# Patient Record
Sex: Female | Born: 1935 | Hispanic: No | Marital: Single
Health system: Midwestern US, Academic
[De-identification: ages and names within clinical notes are randomized; demographics above are authoritative.]

---

## 2016-09-09 ENCOUNTER — Encounter: Admit: 2016-09-09 | Discharge: 2016-09-09 | Payer: MEDICARE

## 2016-09-15 ENCOUNTER — Encounter: Admit: 2016-09-15 | Discharge: 2016-09-15 | Payer: MEDICARE

## 2016-09-23 ENCOUNTER — Encounter: Admit: 2016-09-23 | Discharge: 2016-09-23 | Payer: MEDICARE

## 2016-09-29 ENCOUNTER — Encounter: Admit: 2016-09-29 | Discharge: 2016-09-29 | Payer: MEDICARE

## 2016-09-29 ENCOUNTER — Ambulatory Visit: Admit: 2016-09-29 | Discharge: 2016-09-30 | Payer: MEDICARE

## 2016-09-29 DIAGNOSIS — R609 Edema, unspecified: Principal | ICD-10-CM

## 2016-09-29 DIAGNOSIS — R030 Elevated blood-pressure reading, without diagnosis of hypertension: ICD-10-CM

## 2016-09-29 DIAGNOSIS — Z950 Presence of cardiac pacemaker: ICD-10-CM

## 2016-09-29 DIAGNOSIS — I2510 Atherosclerotic heart disease of native coronary artery without angina pectoris: ICD-10-CM

## 2016-09-29 DIAGNOSIS — D499 Neoplasm of unspecified behavior of unspecified site: Principal | ICD-10-CM

## 2016-09-29 DIAGNOSIS — I4891 Unspecified atrial fibrillation: ICD-10-CM

## 2016-09-29 DIAGNOSIS — R0602 Shortness of breath: ICD-10-CM

## 2016-09-29 DIAGNOSIS — Z45010 Encounter for checking and testing of cardiac pacemaker pulse generator [battery]: ICD-10-CM

## 2016-09-29 DIAGNOSIS — E785 Hyperlipidemia, unspecified: ICD-10-CM

## 2016-09-29 NOTE — Progress Notes
.  As he could take several days for the peak effect of antihypertensive therapy to fully reveal itselfDate of Service: 09/29/2016    Stephanie Meadows is a 83 y.o. female.       HPI     Stephanie Meadows presents today for a follow-up of her hypertension with history of bradycardia and pacemaker implant.  She has had paroxysmal atrial fibrillation but none recently while on sotalol.  She is getting care at the [facility] after spending her life as a none.  She has records from there that show blood pressure readings in the past month between 120 and 190 systolic with diastolics generally around 80.  She is not symptomatic from these but they are well documented with multiple readings over 180.  Handwritten note on the [facility] computerized blood pressure report indicates that she is started on losartan HCTZ 100/25 on June 19 at the direction of her new primary care physician Dr. Herschell Dimes.  Her blood pressure readings since then in the [facility] records are trending lower than the readings that were showing 180 before adding the losartan HCTZ although she still has readings in the 160 range.    She has records from April [facility] ED visit with hyponatremia at 126 leading to discontinuation of hydrochlorothiazide. Her last sodium (in May) being 133.  She is comfortable with her environment at [employer] and night times in the doing center.  She is very cautious about her poor balance and uses a walker or wheelchair almost exclusively as she is aware of the risks of falling leading to durable complications.  She has not fallen she has not lost consciousness.  She has not felt that she has had atrial fibrillation.  She told me today that she hoped that she would be doing as well in my opinion as she is in her opinion and that she would get off easy.           Vitals:    09/29/16 1114   BP: 128/62   Pulse: 74   Weight: 64.4 kg (142 lb)   Height: 1.549 m (5' 1) Body mass index is 26.83 kg/m???.     Past Medical History  Patient Active Problem List    Diagnosis Date Noted   ??? On continuous oral anticoagulation 11/27/2015   ??? Syncope, near 01/22/2015   ??? Cardiac pacemaker in situ 04/16/2014   ??? Edema 12/13/2012     2012 associated with norvasc  2014 associated with cellulitis ntpBNP normal at 426       ??? Essential hypertension 12/26/2009     BP 180+ in  7/11 when in ER with dizziness. Dx hypertension not established.   Jan 2012, further hypertension with dysequilibrium. 1/2 CT head: Age appropriate changes with no mass or bleed, carotid US: Minor early vascular disease  05/30/10 BP 120/80 Dr D. Seibert office, 2-3+ Pitting edema on Norvasc 10, drug DC'd as likely cause, edema improves significantly.   06/17/10 BP 144/68,  Trace edema MAC office CBP. Pt's home BP logs show readign to 159/86 off Norvasc 10 on no Rx. CMP 2/23: BUN 11 Creat 0.8, K 4.1       ??? Shortness of breath on exertion 11/06/2008     a. 12/00-Sub max 81% HR Ex Echo EF 60%, mild AoSclerosis, pericardial effusion, non-ischemic  b. 2002 Ex echo 85% HR EF 60% non ischemic.   c. 2004 91% HR Ex echo. EF 60% no ischemia  D.2005 Mod  Bruce Stress Echo 13 minute duration, Ef 55%, No ischemia     ??? DDD pacer implant 2004 due to sick sinus syndrome 11/19/2006     a. 4/04 rapid atrial fibrillation with marked slowing with diltiazem  B. 07/18/02 DDD-R pacer, Dr. Naoma Diener. Sotalol 120 BID added as [place] inpt. Coumadin chronic  C  11/19/06 lead replaced, Dr. Kathleene Hazel.  D. 07/04/09 Generator replaced Dr. Bradly Bienenstock after conversion to VVI mode from DDD produced dyspnea ajnd congestion requiring lasix  E. 09/20/12 New RA lead and generator change - Dr. Betti Cruz     ??? AF (paroxysmal atrial fibrillation) (HCC) 11/19/2006     Paroxysmal atrial fibrillation from 8/02 w/ sinus node dysfx and DDDR pacing  a. 4/04 rapid atrial fibrillation with marked slowing with diltiazem  b. Intermittent atrial fibrillation noted on pacer surveillance c 09/2013: Pacer interrogation reveals < 1 episode of AT/AF with atrial rates to 400 up of to 10 hours duration. No symptoms. Sotalol and warfarin contined.      ??? Dyslipidemia 11/19/2006     a. 04/19/03 total 253 Trig 159 HDL 79 LDL 156 on Lipitor 10 w/ possible myalgias, try Vytorin 10/40 left arm swelling with Vytorin, DC'd.  b. 12/25/03 total 276 trig 276 HDL 63 LDL 196 No meds, 9/28 Add Vytorin 10/203/5/10 total 189, trig 152, HDL 71, LDL 97  1/11 total 198 trig 152 HDL 66, LDL 109  06/25/11 total 182 trig 156 HDL 70 LDL 95  09/2013 LDL to 197 off Vytorin 10/20. 05/2014, start simvastatin 20         ??? Osteoporosis 11/19/2006   ??? Personal history of breast cancer 11/19/2006     Bilateral mastectomies           Review of Systems   Constitution: Negative.   HENT: Negative.    Eyes: Negative.    Cardiovascular: Positive for dyspnea on exertion.   Respiratory: Positive for shortness of breath.    Endocrine: Negative.    Hematologic/Lymphatic: Negative.    Skin: Negative.    Musculoskeletal: Negative.    Gastrointestinal: Positive for bowel incontinence.   Genitourinary: Positive for bladder incontinence, hesitancy and urgency.   Neurological: Positive for loss of balance.   Psychiatric/Behavioral: Negative.    Allergic/Immunologic: Negative.        Physical Exam  Wheelchair Exam at her request.   Skin: Non-icteric, skin warm and dry, Left delto-pectoral electronic pulse generator. 3 cm nearly healed slightly pink incision line right forehead  Eyes: Wearing glasses, pupils equal and round   Neck Veins: CVP <6-8, No hepato-jugular reflux  Thyroid: not enlarged   Lungs: Breathing comfortably. Lungs clear to percussion and auscultation. No rales, rhonchi or wheezing   Cardiac: Regular rhythm.. No rub or third sound. No murmur   Abdomen: soft, non-tender,  Pedal Pulses: 1-2+ PT pulses   Lower Extremity Edema: 2-3 mm ankle edema.  Motor: normal strength   Cognitive: Good insight, clear historian, no depression Upper Extremity Exam: Bilateral compression sleeves    Cardiovascular Studies  EKG today shows atrial pacing rate 74 diffuse T-wave changes without deep T inversion.    Problems Addressed Today  No diagnosis found.    Assessment and Plan     Today a week after the addition of losartan 100/HCTZ 25 under Dr. Renold Don direction her blood pressure in my office looks quite good at 128/62.   I believe that today's reading in the office should be an indication that boosting her antihypertensive therapy today because of  prior readings earlier this week in the elevated range is imprudent.  Basically I am going to accept the reading today in clinic as an indication that it would be better to leave her on current medications and see how her blood pressure trend develops that would be to add another medication I could put her at risk for falls.    Dr. Herschell Dimes is seein your blood pressure program looks complex enough with excellent result today in the office with me.  In this setting I think we should leave your medicines as they are.  Dr. Herschell Dimes started you on a powerful medication just a week ago losartan 100/HCTZ 25.  It would be a mistake me to add anything else to that program now.  I would like you want you to stay on that program and see how your blood pressure readings develop over the next few weeks and let him decide about adding another drug when he sees you again.  I was thinking about adding amlodipine to your program gSister Symons monthly.  I think it would be permanently find to leave her on her current medical program until he sees her again in about 3 weeks.  Her atrial fibrillation seems suppressed on current therapy.  She is tolerating the sotalol well.  The pacer which was implanted for sick sinus syndrome is ensuring that she is not going to develop significant bradycardia on the sotalol.  Records indicate she is on warfarin for stroke prevention.  With her extreme attention to fall avoidance and her improvement in sodium I am quite comfortable leaving her on her current medical program.    I am not sure how often I need to see her perhaps again in 6 months.  With close follow-up by Dr. Herschell Dimes at the [facility] I think she could discuss with him whether she could extend the interval between visits with me to a bit longer.      NB: This document was prepared within the Epic(TM) EMR using templates developed by the [facility] of [facility]. It can be accessed online through [facility] feature by clinicians at other Epic institutions.  The free text in this document, was generated through Dragon(TM) software.  Editing and proofreading were done by the author of this document Dr. Mable Paris MD, [facility] principally at the point of care.  In spite of the author's best effort to identify every air introduced by voice to text dictation, some errors that may represent misspelling or misstatements of what was dictated may persist.  If there are questions about content in this document please contact Dr. Hale Bogus.    The written information I provided Ms. Natt at the conclusion of today's encounter is as  follows:    Patient Instructions   I want to leave you on your current blood pressure program.  Dr. Herschell Dimes just increased your medication with the losartan/HCTZ last week.  I think he should get a blood test today to make sure your blood sodium and potassium look good.  I think you should continue to take blood pressure readings at the doing center and let him decide in a month about adding more medication.  I was    I was thinking about adding amlodipine today until I realize you had just started on a powerful new medicine last week.  Amlodipine might be a good choice if Dr. Herschell Dimes is trying to decide what to do next when he sees you.    Not  sure how often I need to see you in follow-up.  You do have a pacer in which we need to keep track of but everything else seems to be doing fine.  If you go back into atrial fibrillation you need to see me or 1 of my colleagues but if that does not happen while on sotalol I think from a heart standpoint you are doing well and we do not need to do anything different.    Call in if you have any questions.  Keep tracking the pacemaker remotely.  Marissa Nestle, MD so I will see you probably every 6 months and he may want to extend at 1 he is months with       ]    Current Medications (including today's revisions)  ??? acetaminophen (TYLENOL) 500 mg tablet Take 500 mg by mouth every 6 hours as needed for Pain. Max of 4,000 mg of acetaminophen in 24 hours.   ??? artificial tears(hypromellose) 0.4 % ophthalmic solution Place 1 drop into or around eye(s) every 4 hours as needed.   ??? C,E,zinc,copper 11/omega3s/lut (OCUVITE ADULT 50 PLUS PO) Take 1 tablet by mouth daily.   ??? ergocalciferol (VITAMIN D-2) 50,000 unit capsule Take 1 capsule by mouth every 7 days.   ??? labetalol (NORMODYNE) 300 mg tablet Take 2 Tabs by mouth twice daily.   ??? loperamide (IMODIUM A-D) 2 mg capsule Take 2 mg by mouth as Needed for Diarrhea. Take 2 capsules by mouth initially, followed by 1 capsule by mouth after each loose stool up to a maximum of 8 tablets in 24 hours.   ??? losartan-hydrochlorothiazide (HYZAAR) 100-25 mg tablet Take 1 tablet by mouth every morning.   ??? melatonin 5 mg tab Take 5 mg by mouth at bedtime daily.   ??? omeprazole DR(+) (PRILOSEC) 20 mg capsule Take 20 mg by mouth daily before breakfast.   ??? polycarbophil (FIBERCON) 625 mg tablet Take 1,250 mg by mouth three times daily.   ??? simvastatin (ZOCOR) 20 mg tablet Take 1 Tab by mouth at bedtime daily.   ??? sotalol (BETAPACE) 120 mg Tab Take 1 Tab by mouth twice daily.   ??? warfarin (COUMADIN) 5 mg tablet Take 1 tablet by mouth daily.

## 2016-10-02 ENCOUNTER — Encounter: Admit: 2016-10-02 | Discharge: 2016-10-02 | Payer: MEDICARE

## 2016-10-02 DIAGNOSIS — R0602 Shortness of breath: ICD-10-CM

## 2016-10-02 DIAGNOSIS — R609 Edema, unspecified: Principal | ICD-10-CM

## 2016-10-02 LAB — COMPREHENSIVE METABOLIC PANEL
Lab: 11
Lab: 131 — ABNORMAL LOW (ref 136–145)
Lab: 134 — ABNORMAL HIGH (ref 83–110)
Lab: 4.2
Lab: 76
Lab: 87
Lab: 91 — ABNORMAL LOW (ref 98–107)
Lab: 19 — ABNORMAL HIGH (ref 0–14)
Lab: 21
Lab: 25
Lab: 25
Lab: 4
Lab: 6.4
Lab: 71

## 2016-10-02 NOTE — Telephone Encounter
Spoke to [place] who called requesting help w sending remote carelink. Discussed tips and tricks of sending, reviewed carelink later in the day. Transmission received. LM on pts VM taht we got the transmission.

## 2016-10-03 ENCOUNTER — Ambulatory Visit: Admit: 2016-10-02 | Discharge: 2016-10-03 | Payer: MEDICARE

## 2016-10-03 DIAGNOSIS — I495 Sick sinus syndrome: Principal | ICD-10-CM

## 2016-10-03 DIAGNOSIS — Z950 Presence of cardiac pacemaker: ICD-10-CM

## 2016-10-12 ENCOUNTER — Encounter: Admit: 2016-10-12 | Discharge: 2016-10-12 | Payer: MEDICARE

## 2016-10-12 DIAGNOSIS — I480 Paroxysmal atrial fibrillation: ICD-10-CM

## 2016-10-12 DIAGNOSIS — I10 Essential (primary) hypertension: Principal | ICD-10-CM

## 2016-10-12 DIAGNOSIS — Z950 Presence of cardiac pacemaker: ICD-10-CM

## 2016-10-15 ENCOUNTER — Encounter: Admit: 2016-10-15 | Discharge: 2016-10-15 | Payer: MEDICARE

## 2016-10-16 ENCOUNTER — Encounter: Admit: 2016-10-16 | Discharge: 2016-10-16 | Payer: MEDICARE

## 2016-11-13 ENCOUNTER — Encounter: Admit: 2016-11-13 | Discharge: 2016-11-13 | Payer: MEDICARE

## 2016-11-18 LAB — THYROID STIMULATING HORMONE-TSH: Lab: 2.3

## 2016-11-18 LAB — CBC: Lab: 5.7

## 2016-11-18 LAB — COMPREHENSIVE METABOLIC PANEL: Lab: 130 — ABNORMAL LOW (ref 136–145)

## 2016-12-21 ENCOUNTER — Encounter: Admit: 2016-12-21 | Discharge: 2016-12-21 | Payer: MEDICARE

## 2016-12-23 ENCOUNTER — Encounter: Admit: 2016-12-23 | Discharge: 2016-12-23 | Payer: MEDICARE

## 2017-01-04 ENCOUNTER — Ambulatory Visit: Admit: 2017-01-04 | Discharge: 2017-01-05 | Payer: MEDICARE

## 2017-01-05 DIAGNOSIS — I495 Sick sinus syndrome: Principal | ICD-10-CM

## 2017-01-05 DIAGNOSIS — Z950 Presence of cardiac pacemaker: Secondary | ICD-10-CM

## 2017-02-05 ENCOUNTER — Encounter: Admit: 2017-02-05 | Discharge: 2017-02-05 | Payer: MEDICARE

## 2017-02-09 ENCOUNTER — Encounter: Admit: 2017-02-09 | Discharge: 2017-02-09 | Payer: MEDICARE

## 2017-04-06 ENCOUNTER — Ambulatory Visit: Admit: 2017-04-05 | Discharge: 2017-04-06 | Payer: MEDICARE

## 2017-04-06 DIAGNOSIS — I495 Sick sinus syndrome: Principal | ICD-10-CM

## 2017-04-06 DIAGNOSIS — Z950 Presence of cardiac pacemaker: ICD-10-CM

## 2017-04-07 ENCOUNTER — Encounter: Admit: 2017-04-07 | Discharge: 2017-04-07 | Payer: MEDICARE

## 2017-04-09 LAB — BASIC METABOLIC PANEL
Lab: 127 — ABNORMAL LOW (ref 136–145)
Lab: 15 — ABNORMAL HIGH (ref 83–110)

## 2017-05-04 ENCOUNTER — Encounter: Admit: 2017-05-04 | Discharge: 2017-05-04 | Payer: MEDICARE

## 2017-06-10 ENCOUNTER — Ambulatory Visit: Admit: 2017-06-10 | Discharge: 2017-06-11 | Payer: MEDICARE

## 2017-06-10 DIAGNOSIS — I10 Essential (primary) hypertension: Principal | ICD-10-CM

## 2017-06-10 DIAGNOSIS — I480 Paroxysmal atrial fibrillation: ICD-10-CM

## 2017-06-10 DIAGNOSIS — Z950 Presence of cardiac pacemaker: ICD-10-CM

## 2017-06-11 ENCOUNTER — Encounter: Admit: 2017-06-11 | Discharge: 2017-06-11 | Payer: MEDICARE

## 2017-06-11 DIAGNOSIS — R55 Syncope and collapse: ICD-10-CM

## 2017-06-11 DIAGNOSIS — Z950 Presence of cardiac pacemaker: Principal | ICD-10-CM

## 2017-06-11 LAB — BASIC METABOLIC PANEL: Lab: 16 — ABNORMAL HIGH (ref 0–14)

## 2017-07-01 ENCOUNTER — Encounter: Admit: 2017-07-01 | Discharge: 2017-07-01 | Payer: MEDICARE

## 2017-07-13 ENCOUNTER — Ambulatory Visit: Admit: 2017-07-13 | Discharge: 2017-07-14 | Payer: MEDICARE

## 2017-07-13 ENCOUNTER — Encounter: Admit: 2017-07-13 | Discharge: 2017-07-13 | Payer: MEDICARE

## 2017-07-13 DIAGNOSIS — I4891 Unspecified atrial fibrillation: ICD-10-CM

## 2017-07-13 DIAGNOSIS — I10 Essential (primary) hypertension: Principal | ICD-10-CM

## 2017-07-13 DIAGNOSIS — I2510 Atherosclerotic heart disease of native coronary artery without angina pectoris: ICD-10-CM

## 2017-07-13 DIAGNOSIS — R030 Elevated blood-pressure reading, without diagnosis of hypertension: ICD-10-CM

## 2017-07-13 DIAGNOSIS — E785 Hyperlipidemia, unspecified: ICD-10-CM

## 2017-07-13 DIAGNOSIS — I480 Paroxysmal atrial fibrillation: ICD-10-CM

## 2017-07-13 DIAGNOSIS — Z950 Presence of cardiac pacemaker: ICD-10-CM

## 2017-07-13 DIAGNOSIS — R0602 Shortness of breath: ICD-10-CM

## 2017-07-13 DIAGNOSIS — D499 Neoplasm of unspecified behavior of unspecified site: Principal | ICD-10-CM

## 2017-07-13 DIAGNOSIS — Z7901 Long term (current) use of anticoagulants: ICD-10-CM

## 2017-07-13 DIAGNOSIS — Z45010 Encounter for checking and testing of cardiac pacemaker pulse generator [battery]: ICD-10-CM

## 2017-08-12 ENCOUNTER — Encounter: Admit: 2017-08-12 | Discharge: 2017-08-12 | Payer: MEDICARE

## 2017-09-16 ENCOUNTER — Encounter: Admit: 2017-09-16 | Discharge: 2017-09-16 | Payer: MEDICARE

## 2017-11-04 DEATH — deceased

## 2017-12-16 ENCOUNTER — Encounter: Admit: 2017-12-16 | Discharge: 2017-12-16 | Payer: MEDICARE

## 2018-03-17 ENCOUNTER — Encounter: Admit: 2018-03-17 | Discharge: 2018-03-17 | Payer: MEDICARE

## 2018-04-01 ENCOUNTER — Encounter: Admit: 2018-04-01 | Discharge: 2018-04-01 | Payer: MEDICARE
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
